# Patient Record
Sex: Female | Born: 1990 | Race: White | Hispanic: No | Marital: Married | Smoking: Never smoker
Health system: Southern US, Community
[De-identification: ages and names within clinical notes are randomized; demographics above are authoritative.]

## PROBLEM LIST (undated history)

## (undated) HISTORY — PX: OTHER SURGICAL HISTORY: SHX169

---

## 2011-07-25 ENCOUNTER — Ambulatory Visit (INDEPENDENT_AMBULATORY_CARE_PROVIDER_SITE_OTHER): Payer: 59 | Admitting: Internal Medicine

## 2011-07-25 VITALS — BP 135/82 | HR 76 | Temp 98.3°F | Resp 16 | Ht 65.0 in | Wt 147.0 lb

## 2011-07-25 DIAGNOSIS — J029 Acute pharyngitis, unspecified: Secondary | ICD-10-CM

## 2011-07-25 DIAGNOSIS — J019 Acute sinusitis, unspecified: Secondary | ICD-10-CM

## 2011-07-25 DIAGNOSIS — R05 Cough: Secondary | ICD-10-CM

## 2011-07-25 LAB — POCT CBC
HCT, POC: 39.7 % (ref 37.7–47.9)
Lymph, poc: 3.7 — AB (ref 0.6–3.4)
MCHC: 32.7 g/dL (ref 31.8–35.4)
MCV: 90.1 fL (ref 80–97)
MID (cbc): 0.7 (ref 0–0.9)
POC LYMPH PERCENT: 30 %L (ref 10–50)
Platelet Count, POC: 238 10*3/uL (ref 142–424)
RDW, POC: 13.6 %

## 2011-07-25 MED ORDER — AMOXICILLIN 875 MG PO TABS: 875.0000 mg | ORAL_TABLET | Freq: Two times a day (BID) | ORAL | Status: AC

## 2011-07-25 NOTE — Progress Notes (Signed)
  Subjective:    Patient ID: Alexandra Clayton, female    DOB: 01-Aug-1990, 21 y.o.   MRN: [id number]  HPIFour-day history of pharyngitis evaluated at the [facility] 3 d ago w/ -RS and -cult. Her last 24 hours has developed a cough that is nonproductive, and pressure in the head and ears with fatigue. No past history of mono. Has history of only mild allergies requiring no medicines She also now has laryngitis   [employer]in summer school Review of Systems     Objective:   Physical Exam Hoarse Eyes clear TMs intact Nares boggy with purulent and tender maxillary areas Throat slightly injected without exudate 2+ a.c. Nodes bilaterally that are tender Chest is clear       Results for orders placed in visit on 07/25/11  POCT CBC      Component Value Range   WBC 12.2 (*) 4.6 - 10.2 (K/uL)   Lymph, poc 3.7 (*) 0.6 - 3.4    POC LYMPH PERCENT 30.0  10 - 50 (%L)   MID (cbc) 0.7  0 - 0.9    POC MID % 5.9  0 - 12 (%M)   POC Granulocyte 7.8 (*) 2 - 6.9    Granulocyte percent 64.1  37 - 80 (%G)   RBC 4.41  4.04 - 5.48 (M/uL)   Hemoglobin 13.0  12.2 - 16.2 (g/dL)   HCT, POC 84.6  96.2 - 47.9 (%)   MCV 90.1  80 - 97 (fL)   MCH, POC 29.5  27 - 31.2 (pg)   MCHC 32.7  31.8 - 35.4 (g/dL)   RDW, POC 95.2     Platelet Count, POC 238  142 - 424 (K/uL)   MPV 11.3  0 - 99.8 (fL)    Assessment & Plan:  Problem #1 pharyngitis Problem #2 laryngitis Problem #3 adenopathy Problem #4 leukocytosis Problem #5 sinusitis likely  Meds ordered this encounter  Medications  . Sudafed 12hr OTC      . amoxicillin (AMOXIL) 875 MG tablet    Sig: Take 1 tablet (875 mg total) by mouth 2 (two) times daily.    Dispense:  20 tablet    Refill:  0

## 2012-05-23 ENCOUNTER — Ambulatory Visit (INDEPENDENT_AMBULATORY_CARE_PROVIDER_SITE_OTHER): Payer: BC Managed Care – PPO | Admitting: Physician Assistant

## 2012-05-23 VITALS — BP 128/91 | HR 78 | Temp 98.2°F | Resp 16 | Ht 66.0 in | Wt 141.0 lb

## 2012-05-23 DIAGNOSIS — R350 Frequency of micturition: Secondary | ICD-10-CM

## 2012-05-23 DIAGNOSIS — N390 Urinary tract infection, site not specified: Secondary | ICD-10-CM

## 2012-05-23 LAB — POCT UA - MICROSCOPIC ONLY
Casts, Ur, LPF, POC: NEGATIVE
Crystals, Ur, HPF, POC: NEGATIVE
Yeast, UA: NEGATIVE

## 2012-05-23 LAB — POCT URINALYSIS DIPSTICK
Ketones, UA: NEGATIVE
Protein, UA: 30
Spec Grav, UA: >=1.03
Urobilinogen, UA: 0.2
pH, UA: 5.5

## 2012-05-23 MED ORDER — NITROFURANTOIN MONOHYD MACRO 100 MG PO CAPS: 100.0000 mg | ORAL_CAPSULE | Freq: Two times a day (BID) | ORAL | Status: DC

## 2012-05-23 MED ORDER — PHENAZOPYRIDINE HCL 100 MG PO TABS: 200.0000 mg | ORAL_TABLET | Freq: Once | ORAL | Status: DC

## 2012-05-23 MED ORDER — PHENAZOPYRIDINE HCL 200 MG PO TABS: 200.0000 mg | ORAL_TABLET | Freq: Three times a day (TID) | ORAL | Status: DC | PRN

## 2012-05-23 NOTE — Progress Notes (Signed)
Subjective:    Patient ID: Alexandra Clayton, female    DOB: Feb 03, 1991, 22 y.o.   MRN: [id number]  HPI  Alexandra Clayton is a very pleasant 22 yr old female here with concern for UTI.  Symptoms woke her up this morning.  Has been having frequency, urgency, dysuria.  May have seen a little pink when she wiped this morning, but no definite hematuria.  She has a history of UTIs, beginning when she was 22 yrs old.  Thinks she gets UTIs about yearly.  Has not used anything for symptoms yet.  No vaginal symptoms.  No concern for STIs.  Was just at GYN last week and was tested, but no results yet.     Review of Systems  Constitutional: Negative for fever and chills.  Gastrointestinal: Positive for abdominal pain (lower). Negative for nausea and vomiting.  Genitourinary: Positive for dysuria, urgency and frequency. Negative for vaginal discharge.  Musculoskeletal: Negative.   Skin: Negative.   Neurological: Negative.        Objective:   Physical Exam  Vitals reviewed. Constitutional: She is oriented to person, place, and time. She appears well-developed and well-nourished. No distress.  HENT:  Head: Normocephalic and atraumatic.  Eyes: Conjunctivae are normal. No scleral icterus.  Cardiovascular: Normal rate, regular rhythm and normal heart sounds.   Pulmonary/Chest: Effort normal and breath sounds normal. She has no wheezes. She has no rales.  Abdominal: Soft. Bowel sounds are normal. She exhibits no distension and no mass. There is tenderness in the suprapubic area. There is no rebound, no guarding and no CVA tenderness.  Neurological: She is alert and oriented to person, place, and time.  Skin: Skin is warm and dry.  Psychiatric: She has a normal mood and affect. Her behavior is normal.     Filed Vitals:   05/23/12 0909  BP: 128/91  Pulse: 78  Temp: 98.2 F (36.8 C)  Resp: 16     Results for orders placed in visit on 05/23/12  POCT URINALYSIS DIPSTICK      Result Value Range   Color, UA  yellow     Clarity, UA cloudy     Glucose, UA neg     Bilirubin, UA neg     Ketones, UA neg     Spec Grav, UA >=1.030     Blood, UA large     pH, UA 5.5     Protein, UA 30     Urobilinogen, UA 0.2     Nitrite, UA neg     Leukocytes, UA small (1+)    POCT UA - MICROSCOPIC ONLY      Result Value Range   WBC, Ur, HPF, POC 6-24     RBC, urine, microscopic TNTC     Bacteria, U Microscopic trace     Mucus, UA trace     Epithelial cells, urine per micros 0-3     Crystals, Ur, HPF, POC neg     Casts, Ur, LPF, POC neg     Yeast, UA neg          Assessment & Plan:  UTI (urinary tract infection) - Plan: POCT urinalysis dipstick, POCT UA - Microscopic Only, Urine culture, nitrofurantoin, macrocrystal-monohydrate, (MACROBID) 100 MG capsule, phenazopyridine (PYRIDIUM) 200 MG tablet, DISCONTINUED: phenazopyridine (PYRIDIUM) tablet 200 mg  Urine frequency - Plan: POCT urinalysis dipstick, POCT UA - Microscopic Only, phenazopyridine (PYRIDIUM) 200 MG tablet, DISCONTINUED: phenazopyridine (PYRIDIUM) tablet 200 mg   Alexandra Clayton is a very pleasant 22 yr  old female with UTI.  Will treat with Macrobid x 5 days.  Urine culture sent, and will adjust therapy if needed.  Push fluids.  Pyridium if needed for symptoms.  If worsening or not improving, pt will let us know.

## 2012-05-23 NOTE — Patient Instructions (Addendum)
Begin taking the Macrobid as directed.  Be sure to finish the full course.  Plenty of fluids (water is best!)  Pyridium if needed for symptoms today and tomorrow.  I will let you know when your culture results are back and if we need to make changes.  Please let me know if you are worsening or not improving.   Urinary Tract Infection Urinary tract infections (UTIs) can develop anywhere along your urinary tract. Your urinary tract is your body's drainage system for removing wastes and extra water. Your urinary tract includes two kidneys, two ureters, a bladder, and a urethra. Your kidneys are a pair of bean-shaped organs. Each kidney is about the size of your fist. They are located below your ribs, one on each side of your spine. CAUSES Infections are caused by microbes, which are microscopic organisms, including fungi, viruses, and bacteria. These organisms are so small that they can only be seen through a microscope. Bacteria are the microbes that most commonly cause UTIs. SYMPTOMS  Symptoms of UTIs may vary by age and gender of the patient and by the location of the infection. Symptoms in young women typically include a frequent and intense urge to urinate and a painful, burning feeling in the bladder or urethra during urination. Older women and men are more likely to be tired, shaky, and weak and have muscle aches and abdominal pain. A fever may mean the infection is in your kidneys. Other symptoms of a kidney infection include pain in your back or sides below the ribs, nausea, and vomiting. DIAGNOSIS To diagnose a UTI, your caregiver will ask you about your symptoms. Your caregiver also will ask to provide a urine sample. The urine sample will be tested for bacteria and white blood cells. White blood cells are made by your body to help fight infection. TREATMENT  Typically, UTIs can be treated with medication. Because most UTIs are caused by a bacterial infection, they usually can be treated with the  use of antibiotics. The choice of antibiotic and length of treatment depend on your symptoms and the type of bacteria causing your infection. HOME CARE INSTRUCTIONS  If you were prescribed antibiotics, take them exactly as your caregiver instructs you. Finish the medication even if you feel better after you have only taken some of the medication.  Drink enough water and fluids to keep your urine clear or pale yellow.  Avoid caffeine, tea, and carbonated beverages. They tend to irritate your bladder.  Empty your bladder often. Avoid holding urine for long periods of time.  Empty your bladder before and after sexual intercourse.  After a bowel movement, women should cleanse from front to back. Use each tissue only once. SEEK MEDICAL CARE IF:   You have back pain.  You develop a fever.  Your symptoms do not begin to resolve within 3 days. SEEK IMMEDIATE MEDICAL CARE IF:   You have severe back pain or lower abdominal pain.  You develop chills.  You have nausea or vomiting.  You have continued burning or discomfort with urination. MAKE SURE YOU:   Understand these instructions.  Will watch your condition.  Will get help right away if you are not doing well or get worse. Document Released: 12/01/2004 Document Revised: 08/23/2011 Document Reviewed: 04/01/2011 [facility] Patient Information 2013 [place], [place].

## 2012-05-27 MED ORDER — FLUCONAZOLE 200 MG PO TABS: 200.0000 mg | ORAL_TABLET | Freq: Every day | ORAL | Status: DC

## 2012-05-27 NOTE — Progress Notes (Signed)
Addendum: Urine culture returned with 25,000 colonies yeast.  Per uptodate recommendations for candida cystitis, will treat with fluconazole x 14 days.

## 2012-05-27 NOTE — Addendum Note (Signed)
Addended by: Godfrey Pick on: 05/27/2012 03:05 PM   Modules accepted: Orders

## 2013-06-12 ENCOUNTER — Ambulatory Visit (INDEPENDENT_AMBULATORY_CARE_PROVIDER_SITE_OTHER): Payer: BC Managed Care – PPO | Admitting: Emergency Medicine

## 2013-06-12 VITALS — BP 120/68 | HR 130 | Temp 98.7°F | Resp 17 | Ht 66.0 in | Wt 143.0 lb

## 2013-06-12 DIAGNOSIS — J018 Other acute sinusitis: Secondary | ICD-10-CM

## 2013-06-12 DIAGNOSIS — J209 Acute bronchitis, unspecified: Secondary | ICD-10-CM

## 2013-06-12 MED ORDER — AMOXICILLIN-POT CLAVULANATE 875-125 MG PO TABS: 1.0000 | ORAL_TABLET | Freq: Two times a day (BID) | ORAL | Status: DC

## 2013-06-12 MED ORDER — PSEUDOEPHEDRINE-GUAIFENESIN ER 60-600 MG PO TB12: 1.0000 | ORAL_TABLET | Freq: Two times a day (BID) | ORAL | Status: AC

## 2013-06-12 NOTE — Progress Notes (Signed)
[facility] [address], [place] [id number] [phone]  Date:  06/12/2013   Name:  Alexandra Clayton Dike   DOB:  1990/07/29   MRN:  [id number]  PCP:  No primary provider on file.    Chief Complaint: Insect Bite, Hot Flashes, Headache, Sore Throat and Nasal Congestion   History of Present Illness:  Alexandra Clayton Mountz is a 23 y.o. very pleasant female patient who presents with the following:  Two weeks ago was bitten by a tick and has two red spots.  No rash or illness.  Now has a week long history nasal congestion and drainage purulent in color.  Has slight cough that is not productive.  No wheezing or shortness of breath.  No nausea or vomiting no stool change or rash. No improvement with over the counter medications or other home remedies. Denies other complaint or health concern today.   There are no active problems to display for this patient.   History reviewed. No pertinent past medical history.  Past Surgical History  Procedure Laterality Date  . Wisedom teeth      History  Substance Use Topics  . Smoking status: Never Smoker   . Smokeless tobacco: Not on file  . Alcohol Use: No    Family History  Problem Relation Age of Onset  . Hypertension Mother   . Hypertension Father   . Cancer Maternal Grandmother   . Cancer Maternal Grandfather   . Cancer Paternal Grandmother     No Known Allergies  Medication list has been reviewed and updated.  Current Outpatient Prescriptions on File Prior to Visit  Medication Sig Dispense Refill  . norethindrone-ethinyl estradiol (JUNEL FE,GILDESS FE,LOESTRIN FE) 1-20 MG-MCG tablet Take 1 tablet by mouth daily.       No current facility-administered medications on file prior to visit.    Review of Systems:  As per HPI, otherwise negative.    Physical Examination: Filed Vitals:   06/12/13 1505  BP: 120/68  Pulse: 130  Temp: 98.7 F (37.1 C)  Resp: 17   Filed Vitals:   06/12/13 1505  Height: 5\' 6"  (1.676  m)  Weight: 143 lb (64.864 kg)   Body mass index is 23.09 kg/(m^2). Ideal Body Weight: Weight in (lb) to have BMI = 25: 154.6  GEN: WDWN, NAD, Non-toxic, A & O x 3 HEENT: Atraumatic, Normocephalic. Neck supple. No masses, No LAD. Ears and Nose: No external deformity. CV: RRR, No M/G/R. No JVD. No thrill. No extra heart sounds. PULM: CTA B, no wheezes, crackles, rhonchi. No retractions. No resp. distress. No accessory muscle use. ABD: S, NT, ND, +BS. No rebound. No HSM. EXTR: No c/c/e NEURO Normal gait.  PSYCH: Normally interactive. Conversant. Not depressed or anxious appearing.  Calm demeanor.    Assessment and Plan: Sinusitis Augmentin mucinex d  Signed,  Phillips OdorJeffery Angelito Hopping, MD

## 2013-06-12 NOTE — Patient Instructions (Signed)
Lyme Disease You may have been bitten by a tick and are to watch for the development of Lyme Disease. Lyme Disease is an infection that is caused by a bacteria The bacteria causing this disease is named Borreilia burgdorferi. If a tick is infected with this bacteria and then bites you, then Lyme Disease may occur. These ticks are carried by deer and rodents such as rabbits and mice and infest grassy as well as forested areas. Fortunately most tick bites do not cause Lyme Disease.  Lyme Disease is easier to prevent than to treat. First, covering your legs with clothing when walking in areas where ticks are possibly abundant will prevent their attachment because ticks tend to stay within inches of the ground. Second, using insecticides containing DEET can be applied on skin or clothing. Last, because it takes about 12 to 24 hours for the tick to transmit the disease after attachment to the human host, you should inspect your body for ticks twice a day when you are in areas where Lyme Disease is common. You must look thoroughly when searching for ticks. The Ixodes tick that carries Lyme Disease is very small. It is around the size of a sesame seed (picture of tick is not actual size). Removal is best done by grasping the tick by the head and pulling it out. Do not to squeeze the body of the tick. This could inject the infecting bacteria into the bite site. Wash the area of the bite with an antiseptic solution after removal.  Lyme Disease is a disease that may affect many body systems. Because of the small size of the biting tick, most people do not notice being bitten. The first sign of an infection is usually a round red rash that extends out from the center of the tick bite. The center of the lesion may be blood colored (hemorrhagic) or have tiny blisters (vesicular). Most lesions have bright red outer borders and partial central clearing. This rash may extend out many inches in diameter, and multiple lesions may  be present. Other symptoms such as fatigue, headaches, chills and fever, general achiness and swelling of lymph glands may also occur. If this first stage of the disease is left untreated, these symptoms may gradually resolve by themselves, or progressive symptoms may occur because of spread of infection to other areas of the body.  Follow up with your caregiver to have testing and treatment if you have a tick bite and you develop any of the above complaints. Your caregiver may recommend preventative (prophylactic) medications which kill bacteria (antibiotics). Once a diagnosis of Lyme Disease is made, antibiotic treatment is highly likely to cure the disease. Effective treatment of late stage Lyme Disease may require longer courses of antibiotic therapy.  MAKE SURE YOU:   Understand these instructions.  Will watch your condition.  Will get help right away if you are not doing well or get worse. Document Released: 05/30/2000 Document Revised: 05/16/2011 Document Reviewed: 08/01/2008 [facility] Patient Information 2014 [place]. Sinusitis Sinusitis is redness, soreness, and swelling (inflammation) of the paranasal sinuses. Paranasal sinuses are air pockets within the bones of your face (beneath the eyes, the middle of the forehead, or above the eyes). In healthy paranasal sinuses, mucus is able to drain out, and air is able to circulate through them by way of your nose. However, when your paranasal sinuses are inflamed, mucus and air can become trapped. This can allow bacteria and other germs to grow and cause infection. Sinusitis can  develop quickly and last only a short time (acute) or continue over a long period (chronic). Sinusitis that lasts for more than 12 weeks is considered chronic.  CAUSES  Causes of sinusitis include:  Allergies.  Structural abnormalities, such as displacement of the cartilage that separates your nostrils (deviated septum), which can decrease the air flow through  your nose and sinuses and affect sinus drainage.  Functional abnormalities, such as when the small hairs (cilia) that line your sinuses and help remove mucus do not work properly or are not present. SYMPTOMS  Symptoms of acute and chronic sinusitis are the same. The primary symptoms are pain and pressure around the affected sinuses. Other symptoms include:  Upper toothache.  Earache.  Headache.  Bad breath.  Decreased sense of smell and taste.  A cough, which worsens when you are lying flat.  Fatigue.  Fever.  Thick drainage from your nose, which often is green and may contain pus (purulent).  Swelling and warmth over the affected sinuses. DIAGNOSIS  Your caregiver will perform a physical exam. During the exam, your caregiver may:  Look in your nose for signs of abnormal growths in your nostrils (nasal polyps).  Tap over the affected sinus to check for signs of infection.  View the inside of your sinuses (endoscopy) with a special imaging device with a light attached (endoscope), which is inserted into your sinuses. If your caregiver suspects that you have chronic sinusitis, one or more of the following tests may be recommended:  Allergy tests.  Nasal culture A sample of mucus is taken from your nose and sent to a lab and screened for bacteria.  Nasal cytology A sample of mucus is taken from your nose and examined by your caregiver to determine if your sinusitis is related to an allergy. TREATMENT  Most cases of acute sinusitis are related to a viral infection and will resolve on their own within 10 days. Sometimes medicines are prescribed to help relieve symptoms (pain medicine, decongestants, nasal steroid sprays, or saline sprays).  However, for sinusitis related to a bacterial infection, your caregiver will prescribe antibiotic medicines. These are medicines that will help kill the bacteria causing the infection.  Rarely, sinusitis is caused by a fungal infection. In  theses cases, your caregiver will prescribe antifungal medicine. For some cases of chronic sinusitis, surgery is needed. Generally, these are cases in which sinusitis recurs more than 3 times per year, despite other treatments. HOME CARE INSTRUCTIONS   Drink plenty of water. Water helps thin the mucus so your sinuses can drain more easily.  Use a humidifier.  Inhale steam 3 to 4 times a day (for example, sit in the bathroom with the shower running).  Apply a warm, moist washcloth to your face 3 to 4 times a day, or as directed by your caregiver.  Use saline nasal sprays to help moisten and clean your sinuses.  Take over-the-counter or prescription medicines for pain, discomfort, or fever only as directed by your caregiver. SEEK IMMEDIATE MEDICAL CARE IF:  You have increasing pain or severe headaches.  You have nausea, vomiting, or drowsiness.  You have swelling around your face.  You have vision problems.  You have a stiff neck.  You have difficulty breathing. MAKE SURE YOU:   Understand these instructions.  Will watch your condition.  Will get help right away if you are not doing well or get worse. Document Released: 02/21/2005 Document Revised: 05/16/2011 Document Reviewed: 03/08/2011 [facility] Patient Information 2014 [place], [place].

## 2017-04-24 ENCOUNTER — Ambulatory Visit (INDEPENDENT_AMBULATORY_CARE_PROVIDER_SITE_OTHER): Payer: 59 | Admitting: Sports Medicine

## 2017-04-24 ENCOUNTER — Ambulatory Visit
Admission: RE | Admit: 2017-04-24 | Discharge: 2017-04-24 | Disposition: A | Discharge status: Home / Self Care | Payer: 59 | Source: Ambulatory Visit | Attending: Sports Medicine | Admitting: Sports Medicine

## 2017-04-24 VITALS — BP 118/84 | Ht 66.0 in | Wt 155.0 lb

## 2017-04-24 DIAGNOSIS — M25571 Pain in right ankle and joints of right foot: Principal | ICD-10-CM

## 2017-04-24 DIAGNOSIS — M25572 Pain in left ankle and joints of left foot: Principal | ICD-10-CM

## 2017-04-24 DIAGNOSIS — G8929 Other chronic pain: Secondary | ICD-10-CM

## 2017-04-24 MED ORDER — MELOXICAM 15 MG PO TABS: ORAL_TABLET | ORAL | 0 refills | Status: DC

## 2017-04-24 NOTE — Progress Notes (Signed)
   Subjective:    Patient ID: Alexandra Clayton, female    DOB: [birth year], 27 y.o.   MRN: [id number]  HPI Alexandra Clayton is a 27 year old female who presents for 1 month of worsening bilateral foot and ankle pain. 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 However, the she has started dancing again recently to prepare for the [place]. Since she has started dancing again she has been having pain on the dorsal aspect of her feet as well as her ankles. The pain is worst while dancing with eversion and inversion of the ankle. She has also been having some swelling at the anterior aspect on her ankles, L>R. She denies any previous ankle fractures or injuries. She denies any numbness, tingling, or any other joint pain.   Review of Systems Negative, except as stated above    Objective:   Physical Exam General: Well appearing, no acute distress Feet: No swelling or erythema. Full ROM and strength, mild dorsal pain with full inversion or eversion. No tenderness to palpation of metatarsals or tarsals. Negative metatarsal squeeze. Negative talar tilt. Negative anterior drawer Arches: Neutral longitudinal and transverse arches.  Bedside US performed, limited images of right ankle obtained: Anterior tibialis and extensor hallucus longus tendons visualized, no evidence of tendonitis. Visualized talar dome, no obvious cortical irregularities or joint space deformities.      Assessment & Plan:  27 year old with 1 month of bilateral foot and ankle pain after starting to train for [place]. Exam is unremarkable for any obvious issues today, US was unremarkable. Given her history of swelling over the ankle joint, question synovitis as probable cause of her symptoms. Bilateral ankle XRs will be obtained. Patient will be started on a course of meloxicam and body helix compression sleeve. Patient has 2 weeks of rest from dance coming up  follow[place] which [place] a lot of walking. We will call the patient with the XR results, and she will follow up in clinic after her trip if she is still having problems. If she is still having pain at that time, consider further evaluation with MRI.  Addendum: X-rays are negative.

## 2017-04-25 ENCOUNTER — Encounter: Payer: Self-pay | Admitting: Sports Medicine

## 2017-05-22 ENCOUNTER — Other Ambulatory Visit: Payer: Self-pay

## 2017-05-22 MED ORDER — MELOXICAM 15 MG PO TABS: ORAL_TABLET | ORAL | 0 refills | Status: DC

## 2018-11-01 ENCOUNTER — Other Ambulatory Visit: Payer: Self-pay

## 2018-11-01 DIAGNOSIS — Z20822 Contact with and (suspected) exposure to covid-19: Secondary | ICD-10-CM

## 2018-11-02 LAB — NOVEL CORONAVIRUS, NAA: SARS-CoV-2, NAA: NOT DETECTED

## 2019-02-28 ENCOUNTER — Ambulatory Visit: Payer: Managed Care, Other (non HMO) | Attending: Internal Medicine

## 2019-02-28 DIAGNOSIS — Z20822 Contact with and (suspected) exposure to covid-19: Secondary | ICD-10-CM

## 2019-03-01 LAB — NOVEL CORONAVIRUS, NAA: SARS-CoV-2, NAA: NOT DETECTED

## 2019-06-20 ENCOUNTER — Ambulatory Visit: Payer: Managed Care, Other (non HMO) | Admitting: Podiatry

## 2019-10-17 ENCOUNTER — Other Ambulatory Visit: Payer: Self-pay | Admitting: Obstetrics and Gynecology

## 2019-10-17 DIAGNOSIS — Z1231 Encounter for screening mammogram for malignant neoplasm of breast: Secondary | ICD-10-CM

## 2019-10-28 ENCOUNTER — Ambulatory Visit: Payer: Managed Care, Other (non HMO)

## 2019-11-06 ENCOUNTER — Ambulatory Visit
Admission: RE | Admit: 2019-11-06 | Discharge: 2019-11-06 | Disposition: A | Discharge status: Home / Self Care | Payer: Managed Care, Other (non HMO) | Source: Ambulatory Visit | Attending: Obstetrics and Gynecology | Admitting: Obstetrics and Gynecology

## 2019-11-06 ENCOUNTER — Other Ambulatory Visit: Payer: Self-pay

## 2019-11-06 DIAGNOSIS — Z1231 Encounter for screening mammogram for malignant neoplasm of breast: Secondary | ICD-10-CM

## 2020-05-06 ENCOUNTER — Telehealth: Payer: Managed Care, Other (non HMO) | Admitting: Family

## 2020-05-06 ENCOUNTER — Encounter: Payer: Self-pay | Admitting: Family

## 2020-05-06 DIAGNOSIS — A084 Viral intestinal infection, unspecified: Secondary | ICD-10-CM

## 2020-05-06 MED ORDER — ONDANSETRON HCL 4 MG PO TABS: 4.0000 mg | ORAL_TABLET | Freq: Three times a day (TID) | ORAL | 0 refills | Status: AC | PRN

## 2020-05-06 NOTE — Progress Notes (Signed)
   Virtual Visit via telephone Note Due to COVID-19 pandemic this visit was conducted virtually. This visit type was conducted due to national recommendations for restrictions regarding the COVID-19 Pandemic (e.g. social distancing, sheltering in place) in an effort to limit this patient's exposure and mitigate transmission in our community. All issues noted in this document were discussed and addressed.  A physical exam was not performed with this format.  I connected with Alexandra Clayton on 05/06/20 at 6:17 pm video  and verified that I am speaking with the correct person using two identifiers. Alexandra Clayton is currently located at home and husband  is currently with her during visit. The provider, Jannifer Rodney, FNP is located in their office at time of visit.  I discussed the limitations, risks, security and privacy concerns of performing an evaluation and management service by telephone and the availability of in person appointments. I also discussed with the patient that there may be a patient responsible charge related to this service. The patient expressed understanding and agreed to proceed.   History and Present Illness:  Pt presents with fever, aches, nausea, diarrhea, and chills.States she has taken two at home COVID tests and they were negative.   Fever  This is a new problem. The current episode started yesterday. The problem occurs intermittently. The problem has been waxing and waning. The maximum temperature noted was 102 to 102.9 F. Associated symptoms include abdominal pain, congestion, coughing, diarrhea, headaches, muscle aches, nausea, sleepiness and a sore throat. Pertinent negatives include no ear pain, urinary pain or vomiting. She has tried acetaminophen and NSAIDs for the symptoms. The treatment provided mild relief.     Review of Systems  Constitutional: Positive for fever.  HENT: Positive for congestion and sore throat. Negative for ear pain.   Respiratory: Positive for  cough.   Gastrointestinal: Positive for abdominal pain, diarrhea and nausea. Negative for vomiting.  Genitourinary: Negative for dysuria.  Neurological: Positive for headaches.  All other systems reviewed and are negative.    Observations/Objective: No SOB or distress noted  Assessment and Plan: 1. Viral gastroenteritis Force fluids Rest Bland diet Zofran as needed Work note given  - ondansetron (ZOFRAN) 4 MG tablet; Take 1 tablet (4 mg total) by mouth every 8 (eight) hours as needed for nausea or vomiting.  Dispense: 20 tablet; Refill: 0  =    I discussed the assessment and treatment plan with the patient. The patient was provided an opportunity to ask questions and all were answered. The patient agreed with the plan and demonstrated an understanding of the instructions.   The patient was advised to call back or seek an in-person evaluation if the symptoms worsen or if the condition fails to improve as anticipated.  The above assessment and management plan was discussed with the patient. The patient verbalized understanding of and has agreed to the management plan. Patient is aware to call the clinic if symptoms persist or worsen. Patient is aware when to return to the clinic for a follow-up visit. Patient educated on when it is appropriate to go to the emergency department.   Time call ended:  6:30 pm   I provided 13 minutes of face-to-face time during this encounter.    Jannifer Rodney, FNP

## 2022-04-13 IMAGING — MG DIGITAL SCREENING BILAT W/ TOMO W/ CAD
8 series · 9 of 24 positions shown · non-contrast
Comparison: None.

CLINICAL DATA: Screening.

EXAM:
DIGITAL SCREENING BILATERAL MAMMOGRAM WITH TOMO AND CAD

[R MLO synth-2D]
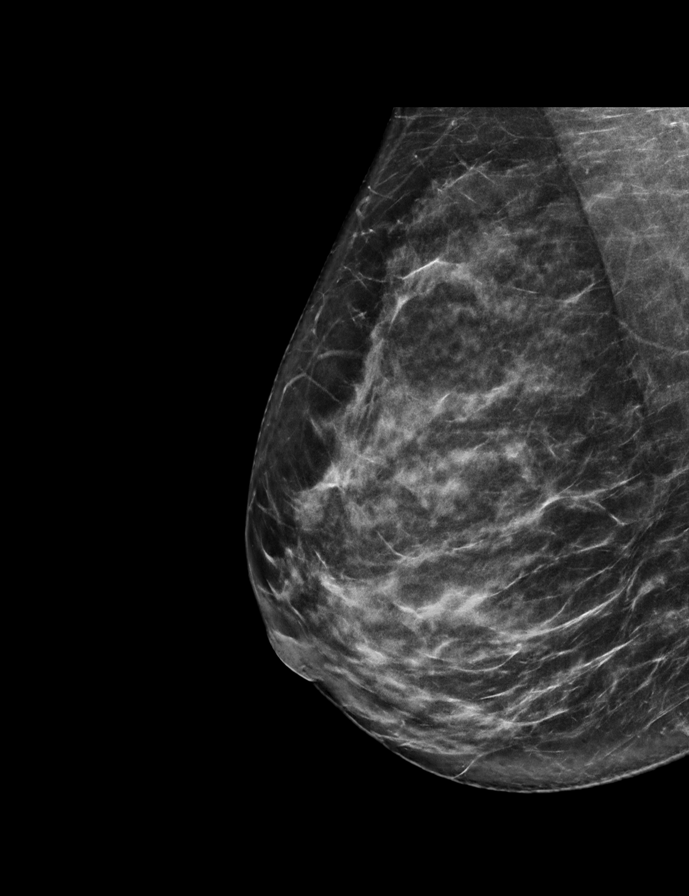

[L MLO synth-2D]
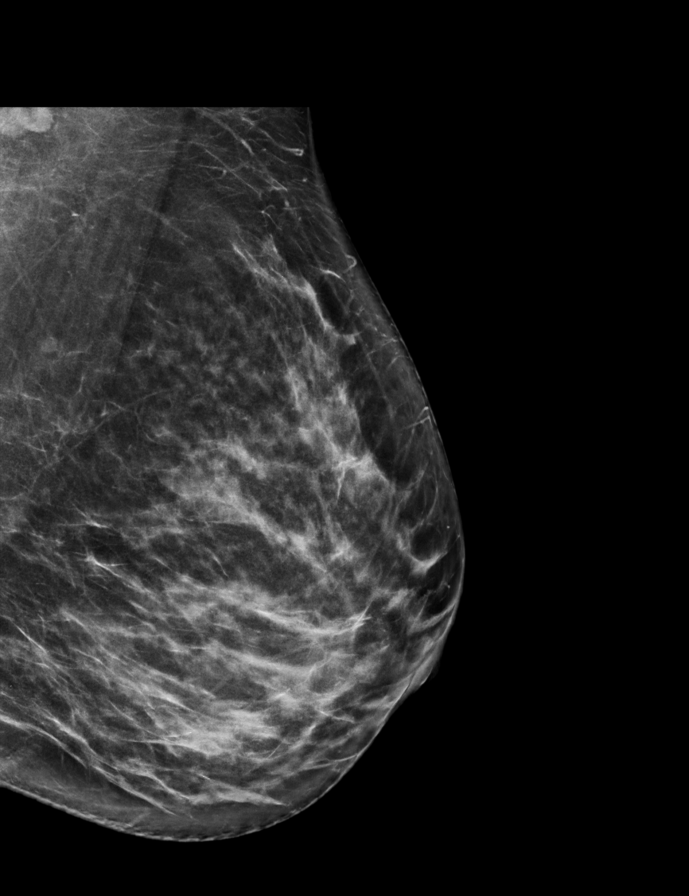

[R CC synth-2D]
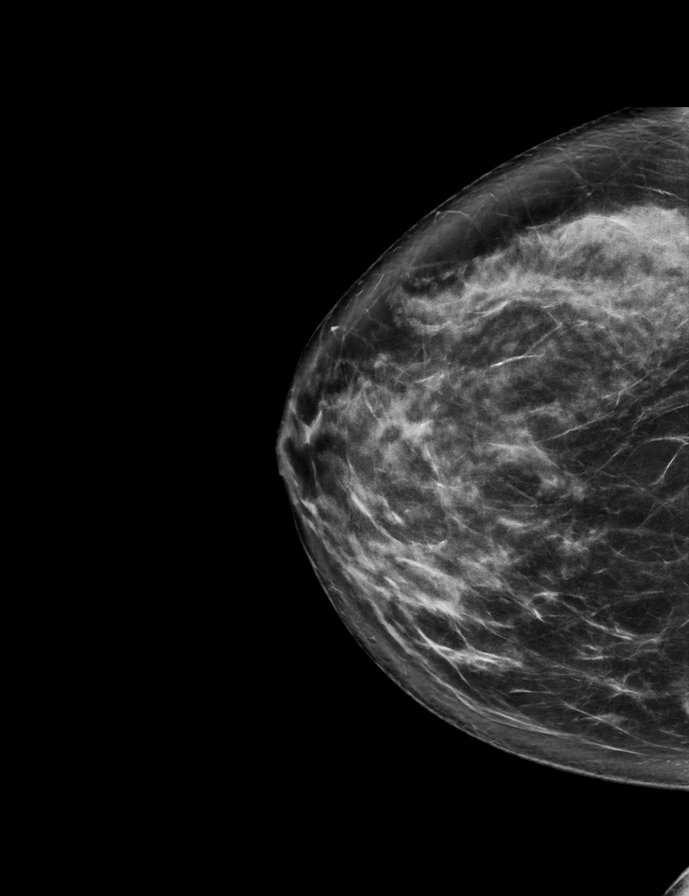

[L CC synth-2D]
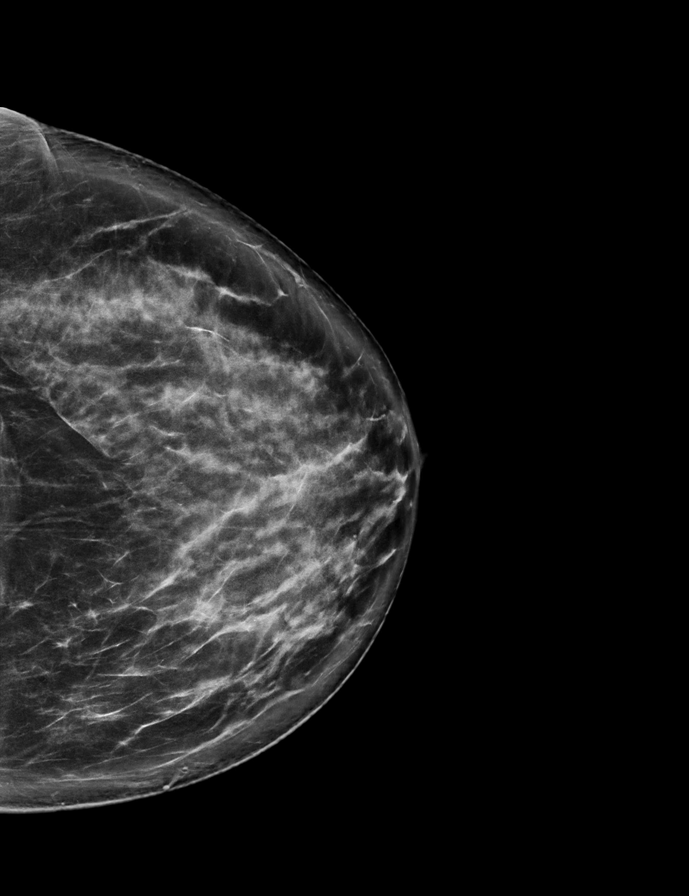

[L MLO tomo · 2 of 73 frames shown]
[frame 24/73]
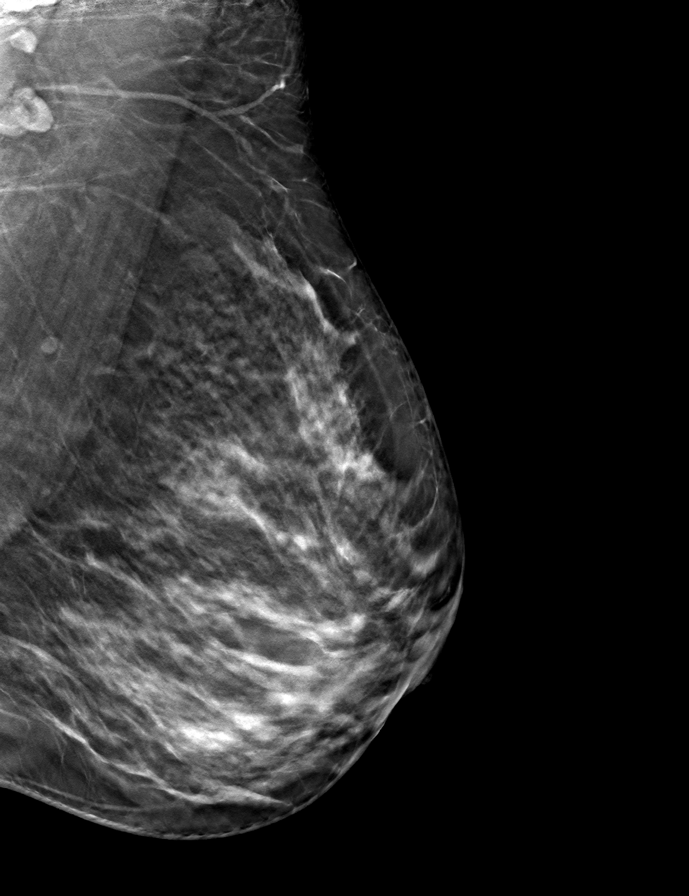
[frame 37/73]
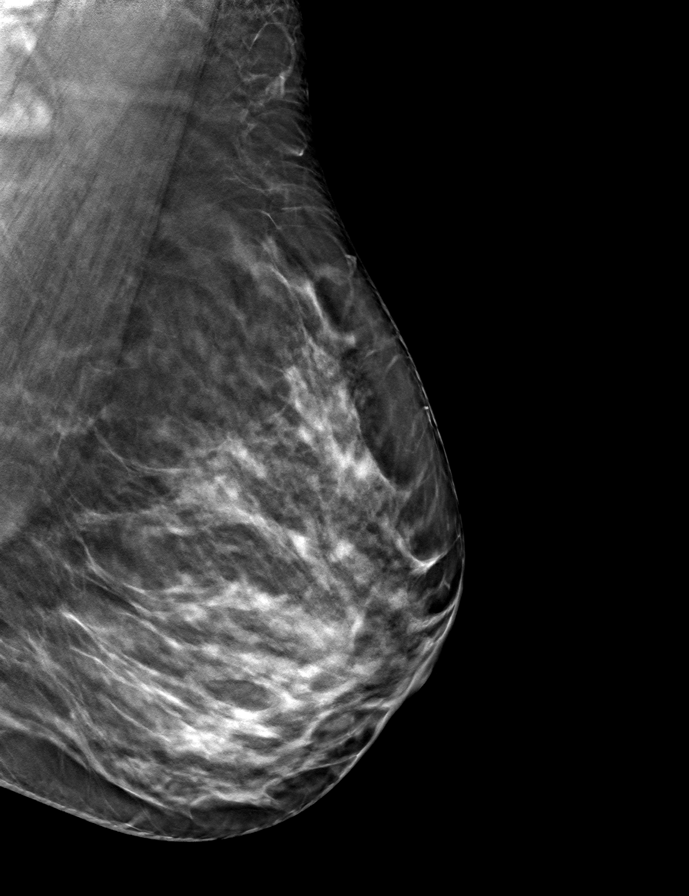

[R CC tomo · tomo slice 39/76.0]
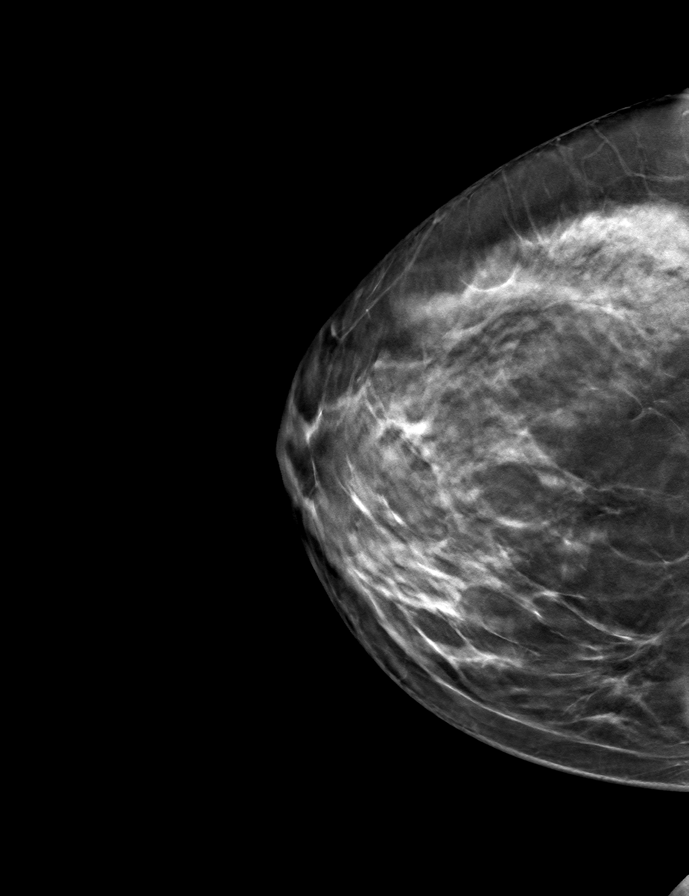

[R MLO tomo · tomo slice 35/70.0]
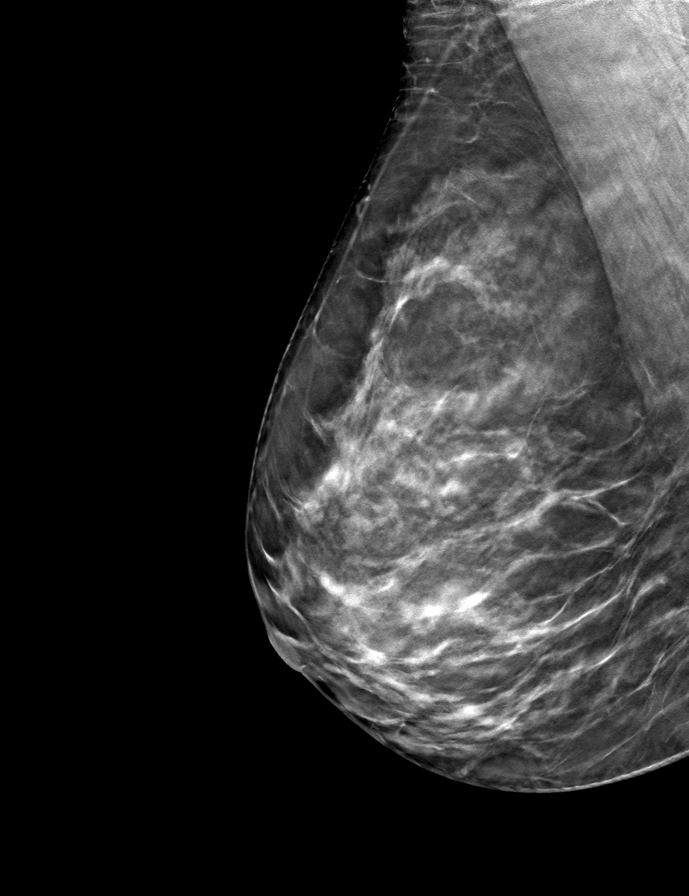

[L CC tomo · tomo slice 42/83.0]
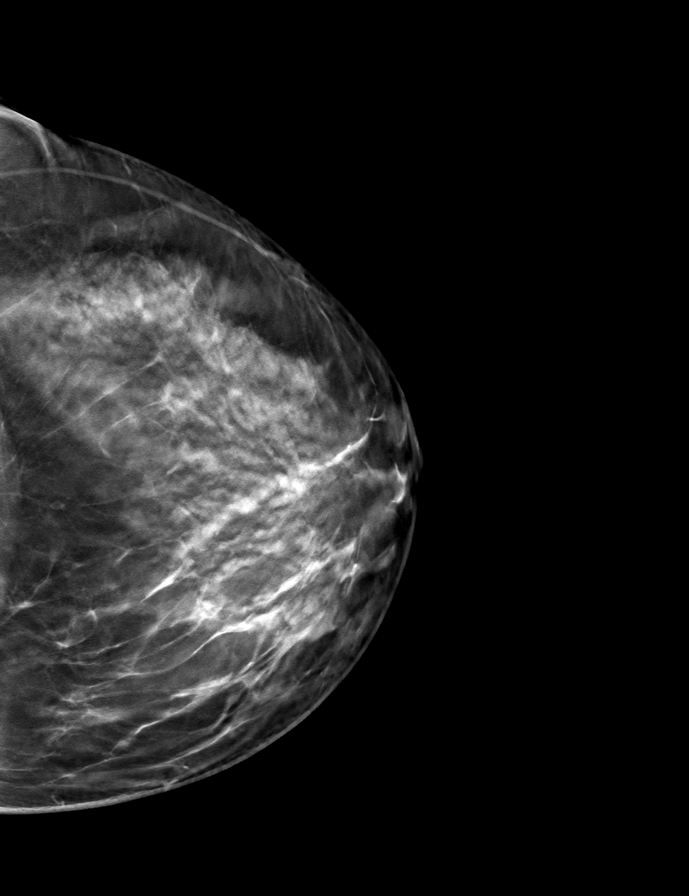

[9 of 24 positions shown; findings below may reference images not displayed]

ACR Breast Density Category c: The breast tissue is heterogeneously
dense, which may obscure small masses
FINDINGS: There are no findings suspicious for malignancy. Images were
processed with CAD.
IMPRESSION: No mammographic evidence of malignancy. A result letter of this
screening mammogram will be mailed directly to the patient.

RECOMMENDATION:
Screening mammogram at age 40. (Code:[id number])

BI-RADS CATEGORY  1: Negative.
